# Patient Record
Sex: Male | Born: 2000 | Race: Black or African American | Hispanic: No | Marital: Single | Smoking: Never smoker
Health system: Southern US, Community
[De-identification: ages and names within clinical notes are randomized; demographics above are authoritative.]

## PROBLEM LIST (undated history)

## (undated) DIAGNOSIS — J45909 Unspecified asthma, uncomplicated: Secondary | ICD-10-CM

---
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
## 2020-02-26 ENCOUNTER — Emergency Department
Admission: EM | Admit: 2020-02-26 | Discharge: 2020-02-26 | Disposition: A | Discharge status: Home / Self Care | Payer: No Typology Code available for payment source | Attending: Emergency Medicine | Admitting: Emergency Medicine

## 2020-02-26 ENCOUNTER — Other Ambulatory Visit: Payer: Self-pay

## 2020-02-26 ENCOUNTER — Encounter: Payer: Self-pay | Admitting: Emergency Medicine

## 2020-02-26 ENCOUNTER — Emergency Department: Payer: No Typology Code available for payment source

## 2020-02-26 DIAGNOSIS — Z041 Encounter for examination and observation following transport accident: Secondary | ICD-10-CM | POA: Insufficient documentation

## 2020-02-26 DIAGNOSIS — Y929 Unspecified place or not applicable: Secondary | ICD-10-CM | POA: Diagnosis not present

## 2020-02-26 DIAGNOSIS — Y939 Activity, unspecified: Secondary | ICD-10-CM | POA: Diagnosis not present

## 2020-02-26 DIAGNOSIS — Y999 Unspecified external cause status: Secondary | ICD-10-CM | POA: Insufficient documentation

## 2020-02-26 DIAGNOSIS — J45909 Unspecified asthma, uncomplicated: Secondary | ICD-10-CM | POA: Insufficient documentation

## 2020-02-26 DIAGNOSIS — S0990XA Unspecified injury of head, initial encounter: Secondary | ICD-10-CM | POA: Diagnosis present

## 2020-02-26 MED ORDER — MELOXICAM 15 MG PO TABS: 15.0000 mg | ORAL_TABLET | Freq: Every day | ORAL | 1 refills | Status: AC

## 2020-02-26 MED ORDER — METHOCARBAMOL 500 MG PO TABS: 500.0000 mg | ORAL_TABLET | Freq: Three times a day (TID) | ORAL | 0 refills | Status: AC | PRN

## 2020-02-26 NOTE — ED Notes (Signed)
Pt refused labs and IV insertion
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
## 2020-07-25 ENCOUNTER — Other Ambulatory Visit: Payer: Self-pay

## 2020-07-25 ENCOUNTER — Emergency Department
Admission: EM | Admit: 2020-07-25 | Discharge: 2020-07-25 | Disposition: A | Discharge status: Home / Self Care | Payer: No Typology Code available for payment source | Attending: Emergency Medicine | Admitting: Emergency Medicine

## 2020-07-25 DIAGNOSIS — S61451A Open bite of right hand, initial encounter: Secondary | ICD-10-CM

## 2020-07-25 DIAGNOSIS — F17210 Nicotine dependence, cigarettes, uncomplicated: Secondary | ICD-10-CM | POA: Insufficient documentation

## 2020-07-25 DIAGNOSIS — M79641 Pain in right hand: Secondary | ICD-10-CM | POA: Diagnosis not present

## 2020-07-25 DIAGNOSIS — R369 Urethral discharge, unspecified: Secondary | ICD-10-CM | POA: Diagnosis not present

## 2020-07-25 DIAGNOSIS — N341 Nonspecific urethritis: Secondary | ICD-10-CM | POA: Insufficient documentation

## 2020-07-25 DIAGNOSIS — W540XXA Bitten by dog, initial encounter: Secondary | ICD-10-CM | POA: Insufficient documentation

## 2020-07-25 DIAGNOSIS — Z79899 Other long term (current) drug therapy: Secondary | ICD-10-CM | POA: Diagnosis not present

## 2020-07-25 DIAGNOSIS — N342 Other urethritis: Secondary | ICD-10-CM

## 2020-07-25 MED ORDER — AMOXICILLIN-POT CLAVULANATE 875-125 MG PO TABS: 1.0000 | ORAL_TABLET | Freq: Two times a day (BID) | ORAL | 0 refills | Status: AC

## 2020-07-25 MED ORDER — CEFTRIAXONE SODIUM 1 G IJ SOLR
500.0000 mg | Freq: Once | INTRAMUSCULAR | Status: AC
  Administered 2020-07-25: 500 mg via INTRAMUSCULAR
  Filled 2020-07-25: qty 10

## 2020-07-25 MED ORDER — AZITHROMYCIN 500 MG PO TABS
1000.0000 mg | ORAL_TABLET | Freq: Once | ORAL | Status: AC
  Administered 2020-07-25: 1000 mg via ORAL
  Filled 2020-07-25: qty 2

## 2020-07-25 MED ORDER — LIDOCAINE HCL (PF) 1 % IJ SOLN
2.1000 mL | Freq: Once | INTRAMUSCULAR | Status: AC
  Administered 2020-07-25: 2.1 mL
  Filled 2020-07-25: qty 5
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
## 2020-07-25 NOTE — ED Provider Notes (Signed)
[facility] Emergency Department Provider Note ____________________________________________   First MD Initiated Contact with Patient 07/25/20 1204     (approximate)  I have reviewed the triage vital signs and the nursing notes.   HISTORY  Chief Complaint Animal Bite and Penile Discharge  HPI Brandon Garrison is a 19 y.o. male with history of asthma presents to the emergency department for treatment and evaluation of right hand pain after being bitten by his dog 3 days ago. He says they were playing tug-a-war with a rope and the dog got a little over anxious to get it and accidentally bit his hand. Dog is UTD on vaccinations.  Patient also complains of dysuria and penile discharge x 2-3 days. No known STD contacts.         Past Medical History:  Diagnosis Date  . Asthma     There are no problems to display for this patient.   History reviewed. No pertinent surgical history.  Prior to Admission medications   Medication Sig Start Date End Date Taking? Authorizing Provider  albuterol (PROVENTIL HFA;VENTOLIN HFA) 108 (90 BASE) MCG/ACT inhaler Inhale 2 puffs into the lungs every 6 (six) hours as needed. For wheezing.    [provider]  amoxicillin-clavulanate (AUGMENTIN) 875-125 MG tablet Take 1 tablet by mouth 2 (two) times daily for 10 days. 07/25/20 08/04/20  Chinita Pester, FNP    Allergies Patient has no known allergies.  No family history on file.  Social History Social History   Tobacco Use  . Smoking status: Current Every Day Smoker    Types: Cigarettes  . Smokeless tobacco: Never Used  Substance Use Topics  . Alcohol use: Not Currently  . Drug use: Not Currently    Review of Systems  Constitutional: No fever/chills Eyes: No visual changes. ENT: No sore throat. Cardiovascular: Denies chest pain. Respiratory: Denies shortness of breath. Gastrointestinal: No abdominal pain.  No nausea, no vomiting.  No diarrhea.   No constipation. Genitourinary: Positive for dysuria. Musculoskeletal: Negative for back pain. Skin: Positive for puncture wound to right hand. Neurological: Negative for headaches, focal weakness or numbness. ____________________________________________   PHYSICAL EXAM:  VITAL SIGNS: ED Triage Vitals  Enc Vitals Group     BP 07/25/20 1050 111/64     Pulse Rate 07/25/20 1050 78     Resp 07/25/20 1050 16     Temp 07/25/20 1050 98.7 F (37.1 C)     Temp Source 07/25/20 1050 Oral     SpO2 07/25/20 1050 100 %     Weight 07/25/20 1051 140 lb (63.5 kg)     Height 07/25/20 1051 5\' 10"  (1.778 m)     Head Circumference --      Peak Flow --      Pain Score 07/25/20 1051 8     Pain Loc --      Pain Edu? --      Excl. in GC? --     Constitutional: Alert and oriented. Well appearing and in no acute distress. Eyes: Conjunctivae are normal. Head: Atraumatic. Nose: No congestion/rhinnorhea. Mouth/Throat: Mucous membranes are moist.  Oropharynx non-erythematous. Neck: No stridor.   Hematological/Lymphatic/Immunilogical: No cervical lymphadenopathy. Cardiovascular: Normal rate, regular rhythm. Grossly normal heart sounds. Good peripheral circulation. Respiratory: Normal respiratory effort.  No retractions. Lungs CTAB. Gastrointestinal: Soft and nontender. No distention. No abdominal bruits. No CVA tenderness. Genitourinary: Exam deferred. Musculoskeletal: No lower extremity tenderness nor edema.  No joint effusions. Neurologic:  Normal speech and language.  No gross focal neurologic deficits are appreciated. No gait instability. Skin:  Skin is warm, dry and intact. No rash noted. Psychiatric: Mood and affect are normal. Speech and behavior are normal.  ____________________________________________   LABS (all labs ordered are listed, but only abnormal results are displayed)  Labs Reviewed - No data to display ____________________________________________  EKG  Not  indicated. ____________________________________________  RADIOLOGY  ED MD interpretation:    Not indicated  I, Kem Boroughs, personally viewed and evaluated these images (plain radiographs) as part of my medical decision making, as well as reviewing the written report by the radiologist.  Official radiology report(s): No results found.  ____________________________________________   PROCEDURES  Procedure(s) performed (including Critical Care):  Procedures  ____________________________________________   INITIAL IMPRESSION / ASSESSMENT AND PLAN     19 year old male presenting to the emergency department for treatment and evaluation of dog bite and penile discharge. See HPI. Plan will be to collect sample for GC and Chlamydia. Test will take 3 hours or so to result. Due to dysuria and penile discharge, will go ahead and treat. Pt. In agreement with the plan. For dog bit wound, will treat with Augmentin. He is to return to the ER or follow up with PCP for increase in concern for infection, fever, or other concerns. He was advised that his sexual partner(s) need to be tested/treated and that he should avoid intercourse for the next week.    ___________________________________________   FINAL CLINICAL IMPRESSION(S) / ED DIAGNOSES  Final diagnoses:  Urethritis  Dog bite, hand, right, initial encounter     ED Discharge Orders         Ordered    amoxicillin-clavulanate (AUGMENTIN) 875-125 MG tablet  2 times daily        07/25/20 1257           Brandon Garrison was evaluated in Emergency Department on 07/25/2020 for the symptoms described in the history of present illness. He was evaluated in the context of the global COVID-19 pandemic, which necessitated consideration that the patient might be at risk for infection with the SARS-CoV-2 virus that causes COVID-19. Institutional protocols and algorithms that pertain to the evaluation of patients at risk for COVID-19 are  in a state of rapid change based on information released by regulatory bodies including the CDC and federal and state organizations. These policies and algorithms were followed during the patient's care in the ED.   Note:  This document was prepared using Dragon voice recognition software and may include unintentional dictation errors.   Chinita Pester, FNP 07/25/20 1615    Delton [place]irie, MD 07/26/20 2059

## 2020-07-25 NOTE — ED Notes (Signed)
Says he has two things.  Had dog bit Tuesday to hand.  His own dog with shots utd.  Says today it has pus coming out of the puncture wound.  The second thing is he has penile discharge for 3 days.  He is in nad.

## 2020-07-25 NOTE — ED Triage Notes (Signed)
Pt states he was playing with his dog with a toy and the dog was going for the toy and got his right hand in the process, has a couple of puncture wounds that he says has been draining. Up to date on shots, pt also c/o penile discharge with painful urination.
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
## 2022-03-04 ENCOUNTER — Encounter: Payer: Self-pay | Admitting: Emergency Medicine

## 2022-03-04 ENCOUNTER — Ambulatory Visit
Admission: EM | Admit: 2022-03-04 | Discharge: 2022-03-04 | Disposition: A | Discharge status: Home / Self Care | Payer: No Typology Code available for payment source | Attending: Emergency Medicine | Admitting: Emergency Medicine

## 2022-03-04 DIAGNOSIS — J020 Streptococcal pharyngitis: Secondary | ICD-10-CM

## 2022-03-04 LAB — POCT RAPID STREP A (OFFICE): Rapid Strep A Screen: POSITIVE — AB

## 2022-03-04 MED ORDER — PENICILLIN G BENZATHINE 1200000 UNIT/2ML IM SUSY
1.2000 10*6.[IU] | PREFILLED_SYRINGE | Freq: Once | INTRAMUSCULAR | Status: AC
  Administered 2022-03-04: 1.2 10*6.[IU] via INTRAMUSCULAR

## 2022-03-04 NOTE — ED Provider Notes (Signed)
Renaldo Fiddler    CSN: [id number] Arrival date & time: 03/04/22  1136      History   Chief Complaint Chief Complaint  Patient presents with   Sore Throat    HPI Brandon Garrison is a 21 y.o. male.  Patient presents with fever and sore throat since last night.  Treatment at home with NyQuil.  No rash, cough, shortness of breath, vomiting, diarrhea, or other symptoms.  His medical history includes asthma.  The history is provided by the patient and medical records.    Past Medical History:  Diagnosis Date   Asthma     There are no problems to display for this patient.   No past surgical history on file.     Home Medications    Prior to Admission medications   Medication Sig Start Date End Date Taking? Authorizing Provider  albuterol (PROVENTIL HFA;VENTOLIN HFA) 108 (90 BASE) MCG/ACT inhaler Inhale 2 puffs into the lungs every 6 (six) hours as needed. For wheezing.    [provider]    Family History No family history on file.  Social History Social History   Tobacco Use   Smoking status: Never   Smokeless tobacco: Never  Vaping Use   Vaping Use: Never used  Substance Use Topics   Alcohol use: Not Currently   Drug use: Yes    Types: Marijuana     Allergies   Patient has no known allergies.   Review of Systems Review of Systems  Constitutional:  Positive for fever. Negative for chills.  HENT:  Positive for sore throat. Negative for ear pain.   Respiratory:  Negative for cough and shortness of breath.   Gastrointestinal:  Negative for diarrhea and vomiting.  Skin:  Negative for color change and rash.  All other systems reviewed and are negative.    Physical Exam Triage Vital Signs ED Triage Vitals  Enc Vitals Group     BP      Pulse      Resp      Temp      Temp src      SpO2      Weight      Height      Head Circumference      Peak Flow      Pain Score      Pain Loc      Pain Edu?      Excl. in GC?    No  data found.  Updated Vital Signs BP 115/71   Pulse 87   Temp 100 F (37.8 C)   Resp 18   SpO2 97%   Visual Acuity Right Eye Distance:   Left Eye Distance:   Bilateral Distance:    Right Eye Near:   Left Eye Near:    Bilateral Near:     Physical Exam Vitals and nursing note reviewed.  Constitutional:      General: He is not in acute distress.    Appearance: Normal appearance. He is well-developed. He is not ill-appearing.  HENT:     Head: Normocephalic and atraumatic.     Right Ear: Tympanic membrane normal.     Left Ear: Tympanic membrane normal.     Nose: Nose normal.     Mouth/Throat:     Mouth: Mucous membranes are moist.     Pharynx: Posterior oropharyngeal erythema present.  Cardiovascular:     Rate and Rhythm: Normal rate and regular rhythm.     Heart  sounds: Normal heart sounds.  Pulmonary:     Effort: Pulmonary effort is normal. No respiratory distress.     Breath sounds: Normal breath sounds.  Musculoskeletal:     Cervical back: Neck supple.  Skin:    General: Skin is warm and dry.  Neurological:     Mental Status: He is alert.  Psychiatric:        Mood and Affect: Mood normal.        Behavior: Behavior normal.      UC Treatments / Results  Labs (all labs ordered are listed, but only abnormal results are displayed) Labs Reviewed  POCT RAPID STREP A (OFFICE) - Abnormal; Notable for the following components:      Result Value   Rapid Strep A Screen Positive (*)    All other components within normal limits    EKG   Radiology No results found.  Procedures Procedures (including critical care time)  Medications Ordered in UC Medications  penicillin g benzathine (BICILLIN LA) 1200000 UNIT/2ML injection 1.2 Million Units (has no administration in time range)    Initial Impression / Assessment and Plan / UC Course  I have reviewed the triage vital signs and the nursing notes.  Pertinent labs & imaging results that were available during my  care of the patient were reviewed by me and considered in my medical decision making (see chart for details).  Strep pharyngitis.  Rapid strep positive.  Patient prefers injection rather than oral medication.  Treating with Bicillin LA.  Education provided on strep throat.  Discussed Tylenol or ibuprofen as needed and instructed patient to follow-up with his PCP if his symptoms are not improving.  He agrees to plan of care.   Final Clinical Impressions(s) / UC Diagnoses   Final diagnoses:  Strep pharyngitis     Discharge Instructions      You were given an injection of penicillin for strep throat.  Take Tylenol or ibuprofen as needed for fever or discomfort.  Follow up with your primary care provider if your symptoms are not improving.        ED Prescriptions   None    PDMP not reviewed this encounter.   Mickie Bail, NP 03/04/22 1224

## 2022-03-04 NOTE — Discharge Instructions (Addendum)
You were given an injection of penicillin for strep throat.  Take Tylenol or ibuprofen as needed for fever or discomfort.  Follow up with your primary care provider if your symptoms are not improving.

## 2022-03-04 NOTE — ED Triage Notes (Signed)
Pt presents with ST and fever started last night.

## 2022-05-03 ENCOUNTER — Ambulatory Visit
Admission: EM | Admit: 2022-05-03 | Discharge: 2022-05-03 | Disposition: A | Discharge status: Home / Self Care | Payer: Self-pay | Attending: Emergency Medicine | Admitting: Emergency Medicine

## 2022-05-03 DIAGNOSIS — Z20822 Contact with and (suspected) exposure to covid-19: Secondary | ICD-10-CM | POA: Insufficient documentation

## 2022-05-03 DIAGNOSIS — B349 Viral infection, unspecified: Secondary | ICD-10-CM | POA: Insufficient documentation

## 2022-05-03 DIAGNOSIS — J029 Acute pharyngitis, unspecified: Secondary | ICD-10-CM | POA: Insufficient documentation

## 2022-05-03 DIAGNOSIS — Z79899 Other long term (current) drug therapy: Secondary | ICD-10-CM | POA: Insufficient documentation

## 2022-05-03 LAB — POCT RAPID STREP A (OFFICE): Rapid Strep A Screen: NEGATIVE

## 2022-05-03 MED ORDER — LIDOCAINE VISCOUS HCL 2 % MT SOLN: 15.0000 mL | OROMUCOSAL | 0 refills | Status: AC | PRN

## 2022-05-03 NOTE — ED Provider Notes (Signed)
Renaldo Fiddler    CSN: [id number] Arrival date & time: 05/03/22  1239      History   Chief Complaint Chief Complaint  Patient presents with   Sore Throat    HPI Brandon Garrison is a 21 y.o. male.  Patient presents with 2-day history of sore throat, fever, fatigue, cough, vomiting, diarrhea.  1 episode of emesis and 2 episodes of diarrhea.  No OTC medications taken today.  He denies rash, chest pain, shortness of breath, abdominal pain, or other symptoms.  Patient was seen here on 03/04/2022 for strep pharyngitis and treated with Bicillin LA.  The history is provided by the patient and medical records.    Past Medical History:  Diagnosis Date   Asthma     There are no problems to display for this patient.   History reviewed. No pertinent surgical history.     Home Medications    Prior to Admission medications   Medication Sig Start Date End Date Taking? Authorizing Provider  lidocaine (XYLOCAINE) 2 % solution Use as directed 15 mLs in the mouth or throat as needed for mouth pain. 05/03/22  Yes Mickie Bail, NP  albuterol (PROVENTIL HFA;VENTOLIN HFA) 108 (90 BASE) MCG/ACT inhaler Inhale 2 puffs into the lungs every 6 (six) hours as needed. For wheezing.    [provider]    Family History History reviewed. No pertinent family history.  Social History Social History   Tobacco Use   Smoking status: Never   Smokeless tobacco: Never  Vaping Use   Vaping Use: Never used  Substance Use Topics   Alcohol use: Not Currently   Drug use: Yes    Types: Marijuana     Allergies   Patient has no known allergies.   Review of Systems Review of Systems  Constitutional:  Positive for fatigue and fever. Negative for chills.  HENT:  Positive for sore throat. Negative for ear pain.   Respiratory:  Positive for cough. Negative for shortness of breath.   Cardiovascular:  Negative for chest pain and palpitations.  Gastrointestinal:  Positive for diarrhea  and vomiting. Negative for abdominal pain.  Skin:  Negative for color change and rash.  All other systems reviewed and are negative.    Physical Exam Triage Vital Signs ED Triage Vitals  Enc Vitals Group     BP 05/03/22 1334 109/64     Pulse Rate 05/03/22 1334 (!) 104     Resp 05/03/22 1334 18     Temp 05/03/22 1334 (!) 100.4 F (38 C)     Temp src --      SpO2 05/03/22 1334 97 %     Weight 05/03/22 1336 155 lb (70.3 kg)     Height 05/03/22 1336 5\' 8"  (1.727 m)     Head Circumference --      Peak Flow --      Pain Score 05/03/22 1333 10     Pain Loc --      Pain Edu? --      Excl. in GC? --    No data found.  Updated Vital Signs BP 109/64   Pulse (!) 104   Temp (!) 100.4 F (38 C)   Resp 18   Ht 5\' 8"  (1.727 m)   Wt 155 lb (70.3 kg)   SpO2 97%   BMI 23.57 kg/m   Visual Acuity Right Eye Distance:   Left Eye Distance:   Bilateral Distance:    Right Eye Near:  Left Eye Near:    Bilateral Near:     Physical Exam Vitals and nursing note reviewed.  Constitutional:      General: He is not in acute distress.    Appearance: Normal appearance. He is well-developed. He is not ill-appearing.  HENT:     Right Ear: Tympanic membrane normal.     Left Ear: Tympanic membrane normal.     Nose: Nose normal.     Mouth/Throat:     Mouth: Mucous membranes are moist.     Pharynx: Posterior oropharyngeal erythema present.  Cardiovascular:     Rate and Rhythm: Normal rate and regular rhythm.     Heart sounds: Normal heart sounds.  Pulmonary:     Effort: Pulmonary effort is normal. No respiratory distress.     Breath sounds: Normal breath sounds.  Abdominal:     General: Bowel sounds are normal.     Palpations: Abdomen is soft.     Tenderness: There is no abdominal tenderness. There is no guarding or rebound.  Musculoskeletal:     Cervical back: Neck supple.  Skin:    General: Skin is warm and dry.  Neurological:     Mental Status: He is alert.  Psychiatric:         Mood and Affect: Mood normal.        Behavior: Behavior normal.      UC Treatments / Results  Labs (all labs ordered are listed, but only abnormal results are displayed) Labs Reviewed  SARS CORONAVIRUS 2 (TAT 6-24 HRS)  POCT RAPID STREP A (OFFICE)    EKG   Radiology No results found.  Procedures Procedures (including critical care time)  Medications Ordered in UC Medications - No data to display  Initial Impression / Assessment and Plan / UC Course  I have reviewed the triage vital signs and the nursing notes.  Pertinent labs & imaging results that were available during my care of the patient were reviewed by me and considered in my medical decision making (see chart for details).    Viral illness, sore throat.  Rapid strep negative.  COVID pending.  Discussed symptomatic treatment including Tylenol or ibuprofen, rest, hydration.  Treating sore throat with viscous lidocaine as needed.  Instructed patient to follow up with his PCP if his symptoms are not improving.  He agrees to plan of care.   Final Clinical Impressions(s) / UC Diagnoses   Final diagnoses:  Viral illness  Sore throat     Discharge Instructions      Your strep test is negative.  COVID test is pending.  Take Tylenol or ibuprofen as needed for fever or discomfort.  Rest and keep yourself hydrated.  Use the viscous lidocaine as directed.    Follow-up with your primary care provider if your symptoms are not improving.         ED Prescriptions     Medication Sig Dispense Auth. Provider   lidocaine (XYLOCAINE) 2 % solution Use as directed 15 mLs in the mouth or throat as needed for mouth pain. 100 mL Mickie Bail, NP      PDMP not reviewed this encounter.   Mickie Bail, NP 05/03/22 1358

## 2022-05-03 NOTE — ED Triage Notes (Signed)
Patient to Urgent Care with complaints of a sore throat x2 days. Reports initially the side of his neck was hurting and last night it started to hurt to swallow. Reports low energy and fatigue.

## 2022-05-03 NOTE — Discharge Instructions (Addendum)
Your strep test is negative.  COVID test is pending.  Take Tylenol or ibuprofen as needed for fever or discomfort.  Rest and keep yourself hydrated.  Use the viscous lidocaine as directed.    Follow-up with your primary care provider if your symptoms are not improving.

## 2022-05-04 LAB — SARS CORONAVIRUS 2 (TAT 6-24 HRS): SARS Coronavirus 2: NEGATIVE

## 2024-01-20 ENCOUNTER — Telehealth (HOSPITAL_COMMUNITY): Payer: Self-pay | Admitting: *Deleted

## 2024-01-20 NOTE — Telephone Encounter (Signed)
 Pt called wanting the STI results done the other day.   Confirmed pts name, DOB, address, and Sex, this pt has not been seen since 04/2022. Pt advised not seen at any [facility] and pt verbalized understanding. He will call somewhere else.
